# Patient Record
Sex: Male | Born: 1985 | Race: White | Hispanic: No | Marital: Married | Smoking: Never smoker
Health system: Southern US, Community
[De-identification: ages and names within clinical notes are randomized; demographics above are authoritative.]

---

## 2005-09-12 ENCOUNTER — Inpatient Hospital Stay: Payer: Self-pay | Admitting: Specialist

## 2006-06-29 HISTORY — PX: OPEN REDUCTION INTERNAL FIXATION (ORIF) TIBIA/FIBULA FRACTURE: SHX5992

## 2009-05-20 ENCOUNTER — Emergency Department: Payer: Self-pay | Admitting: Emergency Medicine

## 2010-06-23 ENCOUNTER — Emergency Department: Payer: Self-pay | Admitting: Unknown Physician Specialty

## 2010-09-29 IMAGING — CT CT HEAD WITHOUT CONTRAST
2 series · 16 of 30 positions shown, 20 images · non-contrast
Comparison: none

REASON FOR EXAM: Left side weakness
COMMENTS:   LMP: (Male)

PROCEDURE:     CT  - CT HEAD WITHOUT CONTRAST  - May 20, 2009  [DATE]
RESULT:     Comparison:  None
TECHNIQUE: Multiple axial images from the foramen magnum to the vertex were
obtained without IV contrast.

[Series 2: without · axial · non-contrast · 0.43mm/px · z∈[-209,-89]mm · 13 of 28 slices shown, 17 images]
[im 2/28  brain]
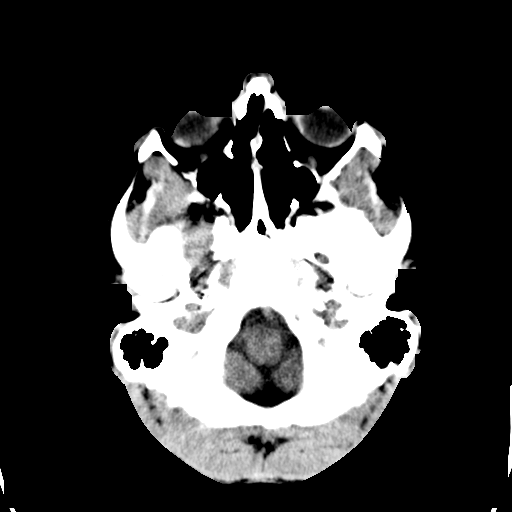
[im 2/28  bone]
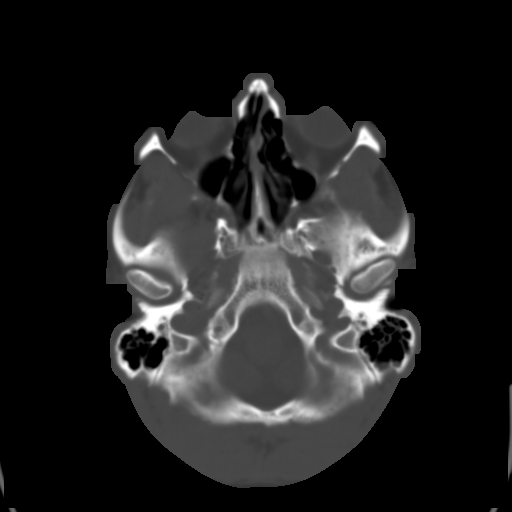
[im 4/28  brain]
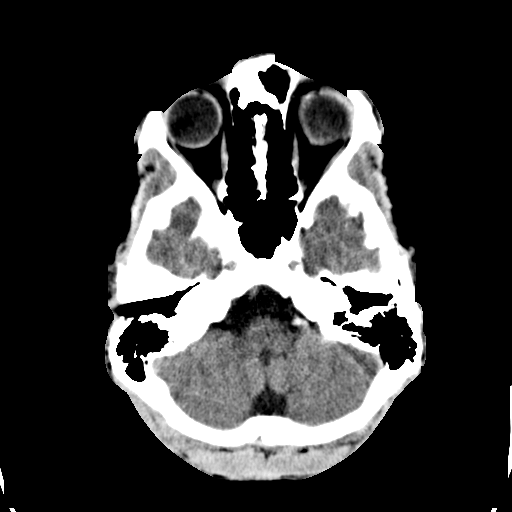
[im 6/28  brain]
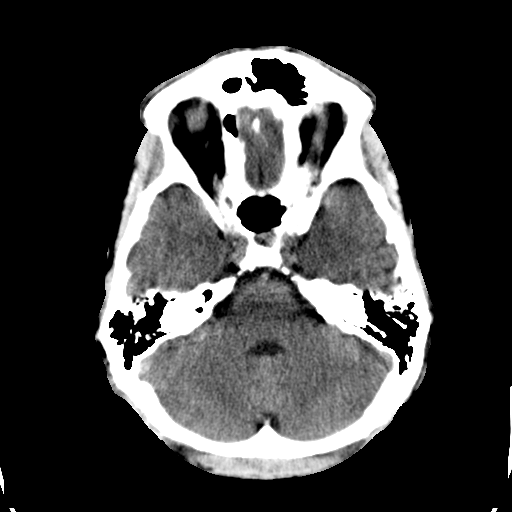
[im 8/28  brain]
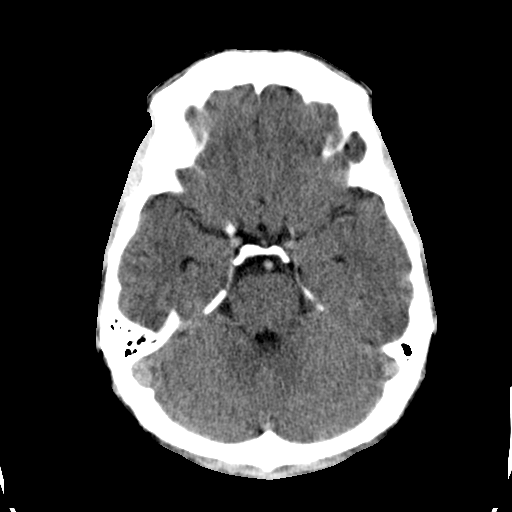
[im 10/28  brain]
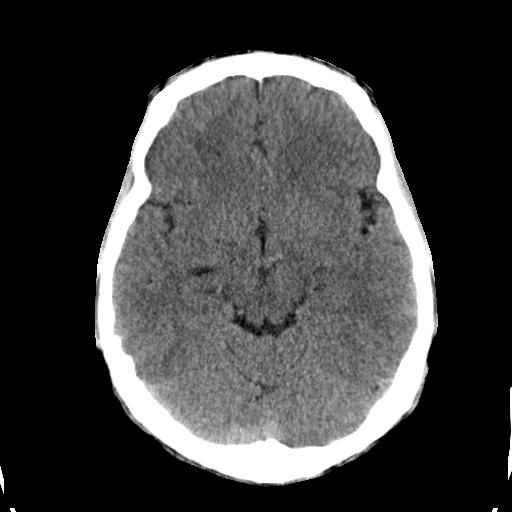
[im 10/28  bone]
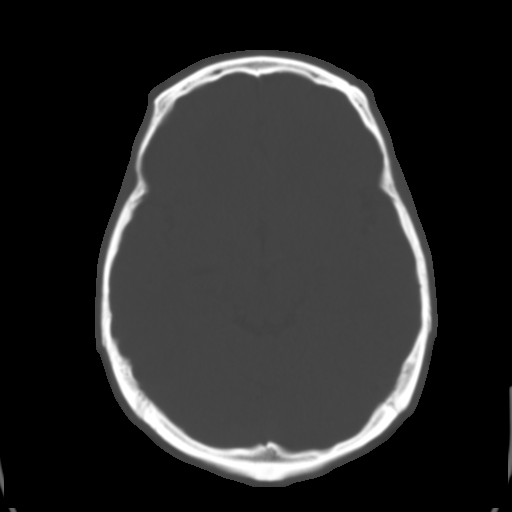
[im 12/28  brain]
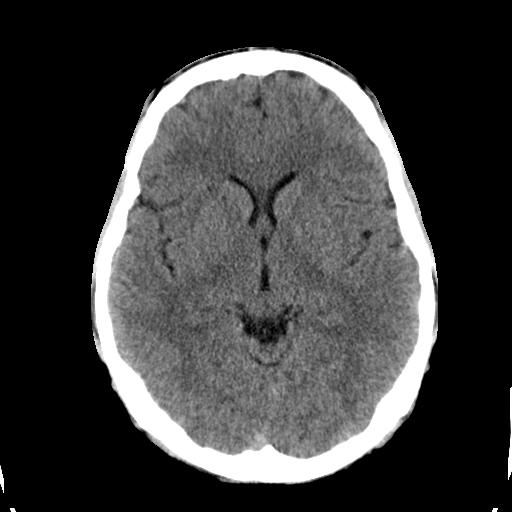
[im 14/28  brain]
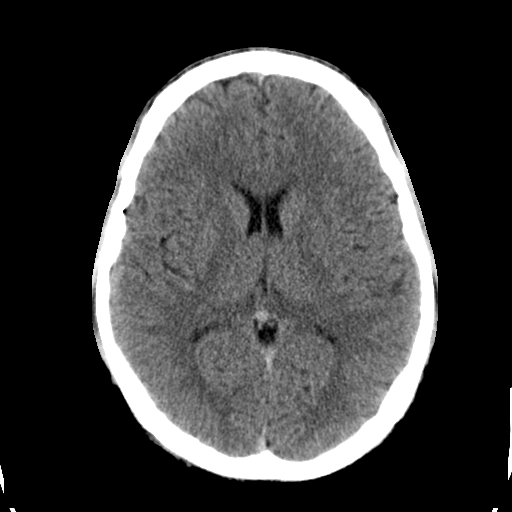
[im 16/28  brain]
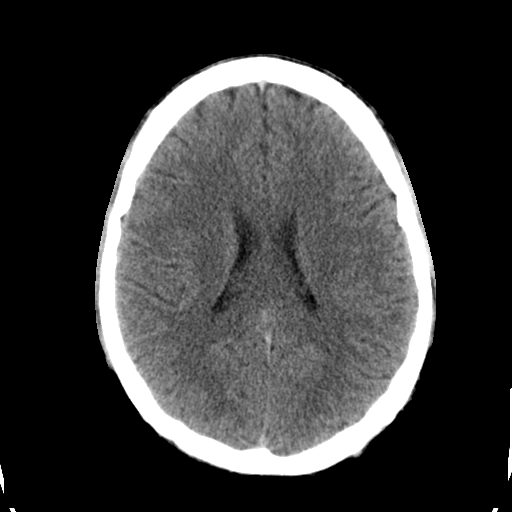
[im 18/28  brain]
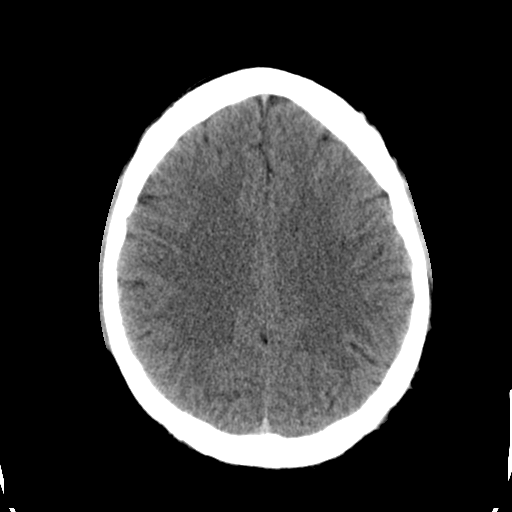
[im 18/28  bone]
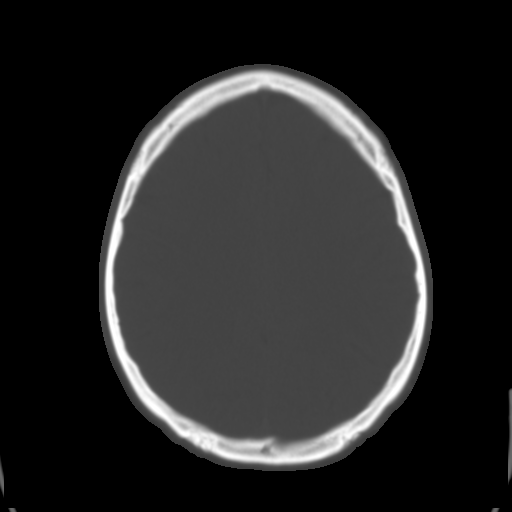
[im 20/28  brain]
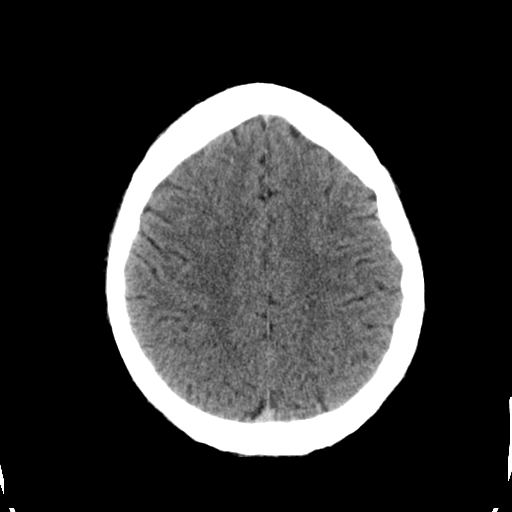
[im 22/28  brain]
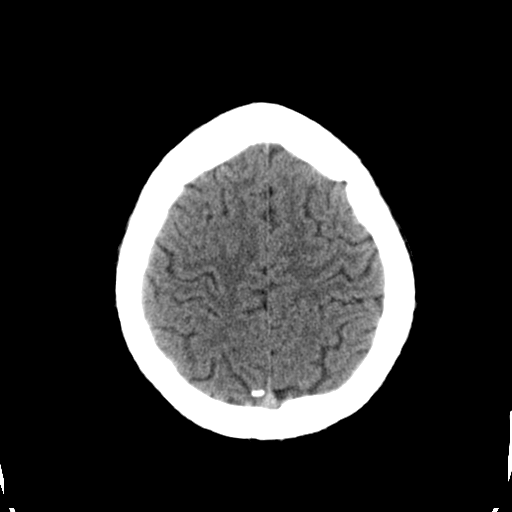
[im 24/28  brain]
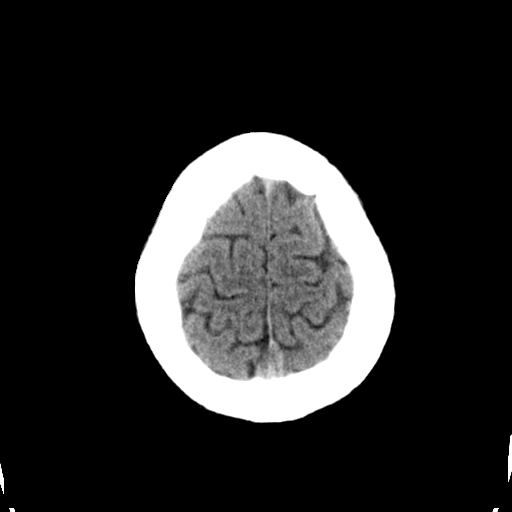
[im 26/28  brain]
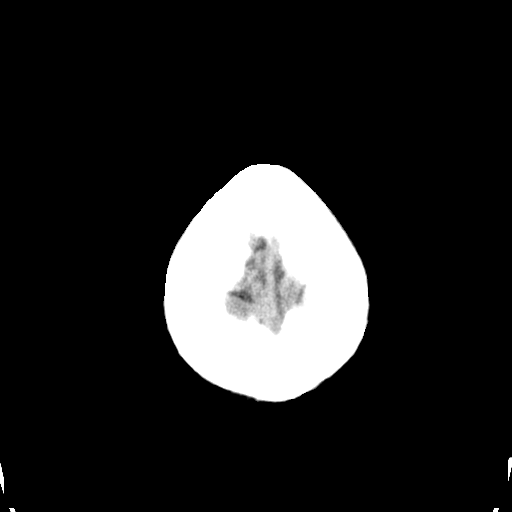
[im 26/28  bone]
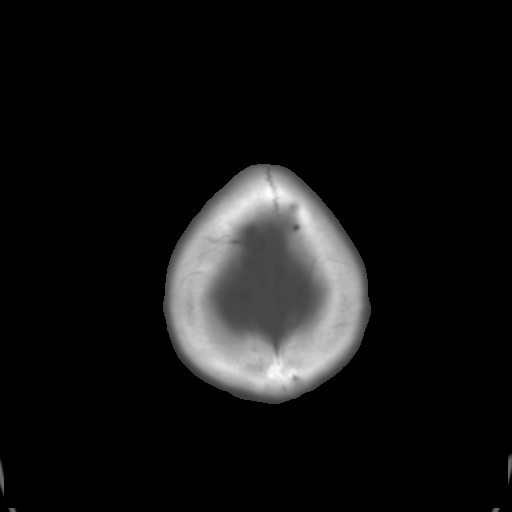

[Series 3: bone · axial · 0.43mm/px · z∈[-209,-169]mm · 3 of 28 slices shown]
[im 2/28  bone]
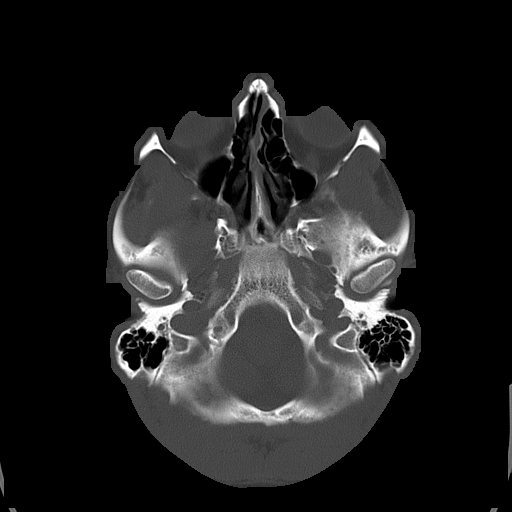
[im 6/28  bone]
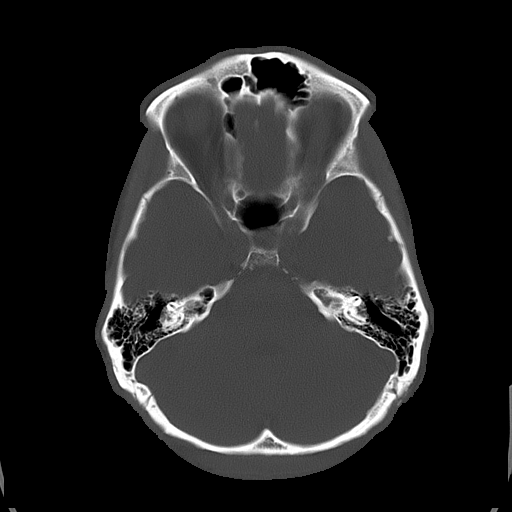
[im 10/28  bone]
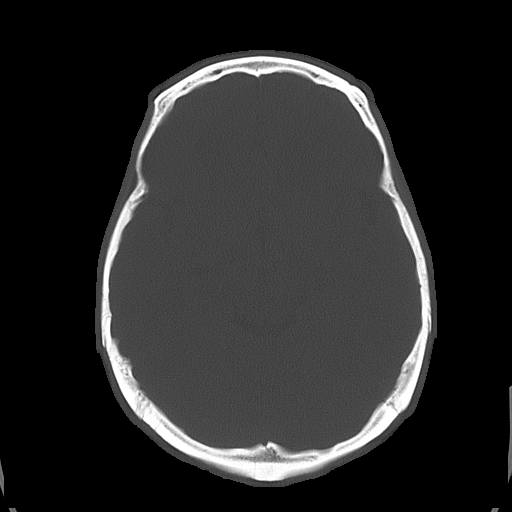

[16 of 30 positions shown; findings below may reference images not displayed]

FINDINGS: There is no evidence of mass effect, midline shift, or extra-axial fluid
collections.  There is no evidence of a space-occupying lesion or
intracranial hemorrhage. There is no evidence of a cortical-based area of
acute infarction.

The ventricles and sulci are appropriate for the patient's age. The basal
cisterns are patent.

Visualized portions of the orbits are unremarkable. The visualized portions
of the paranasal sinuses and mastoid air cells are unremarkable.

The osseous structures are unremarkable.
IMPRESSION: No acute intracranial process.

## 2011-11-02 IMAGING — CR LEFT THUMB 2+V
1 series · 3 of 3 positions shown · non-contrast
Comparison: none

REASON FOR EXAM: injury
COMMENTS:

PROCEDURE:     DXR - DXR THUMB LEFT HAND (1ST DIGIT)  - June 23, 2010  [DATE]
RESULT:     Images of the left thumb demonstrate no definite fracture,
dislocation or radiopaque foreign body.

[Series 1: view not recorded · 0.17mm/px · 3 of 3 slices shown]
[im 1/3]
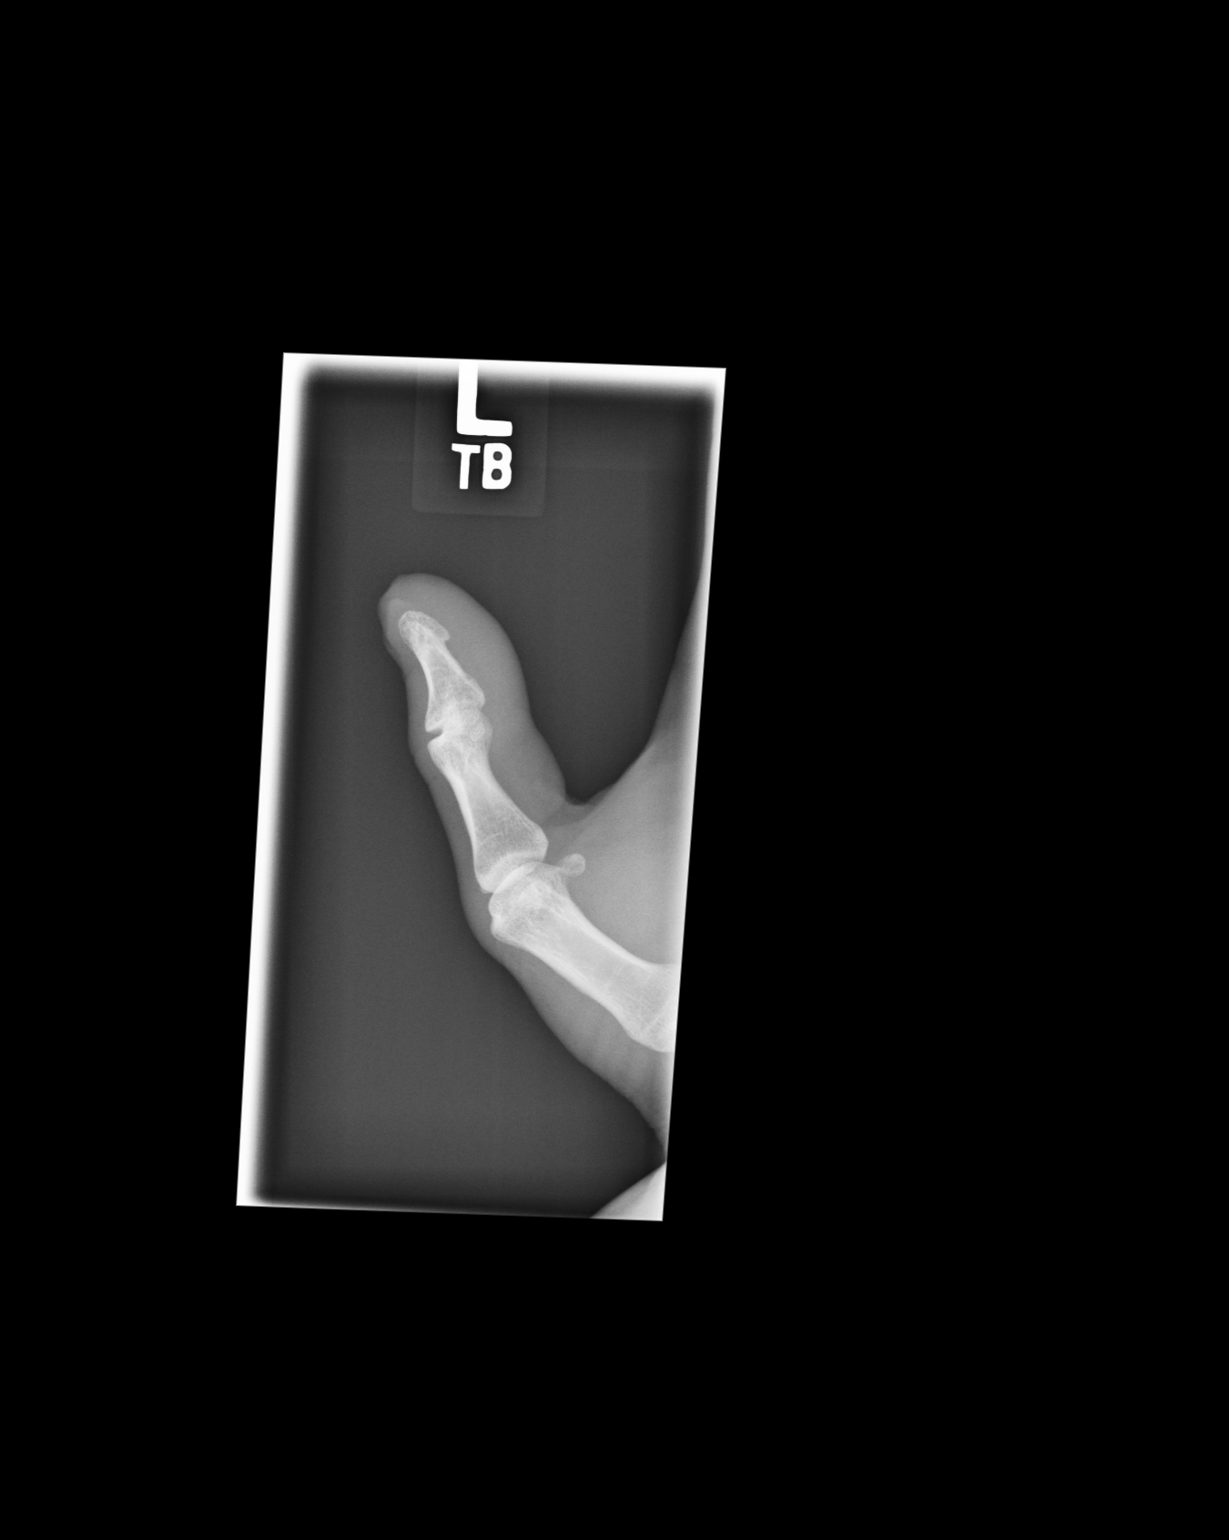
[im 2/3]
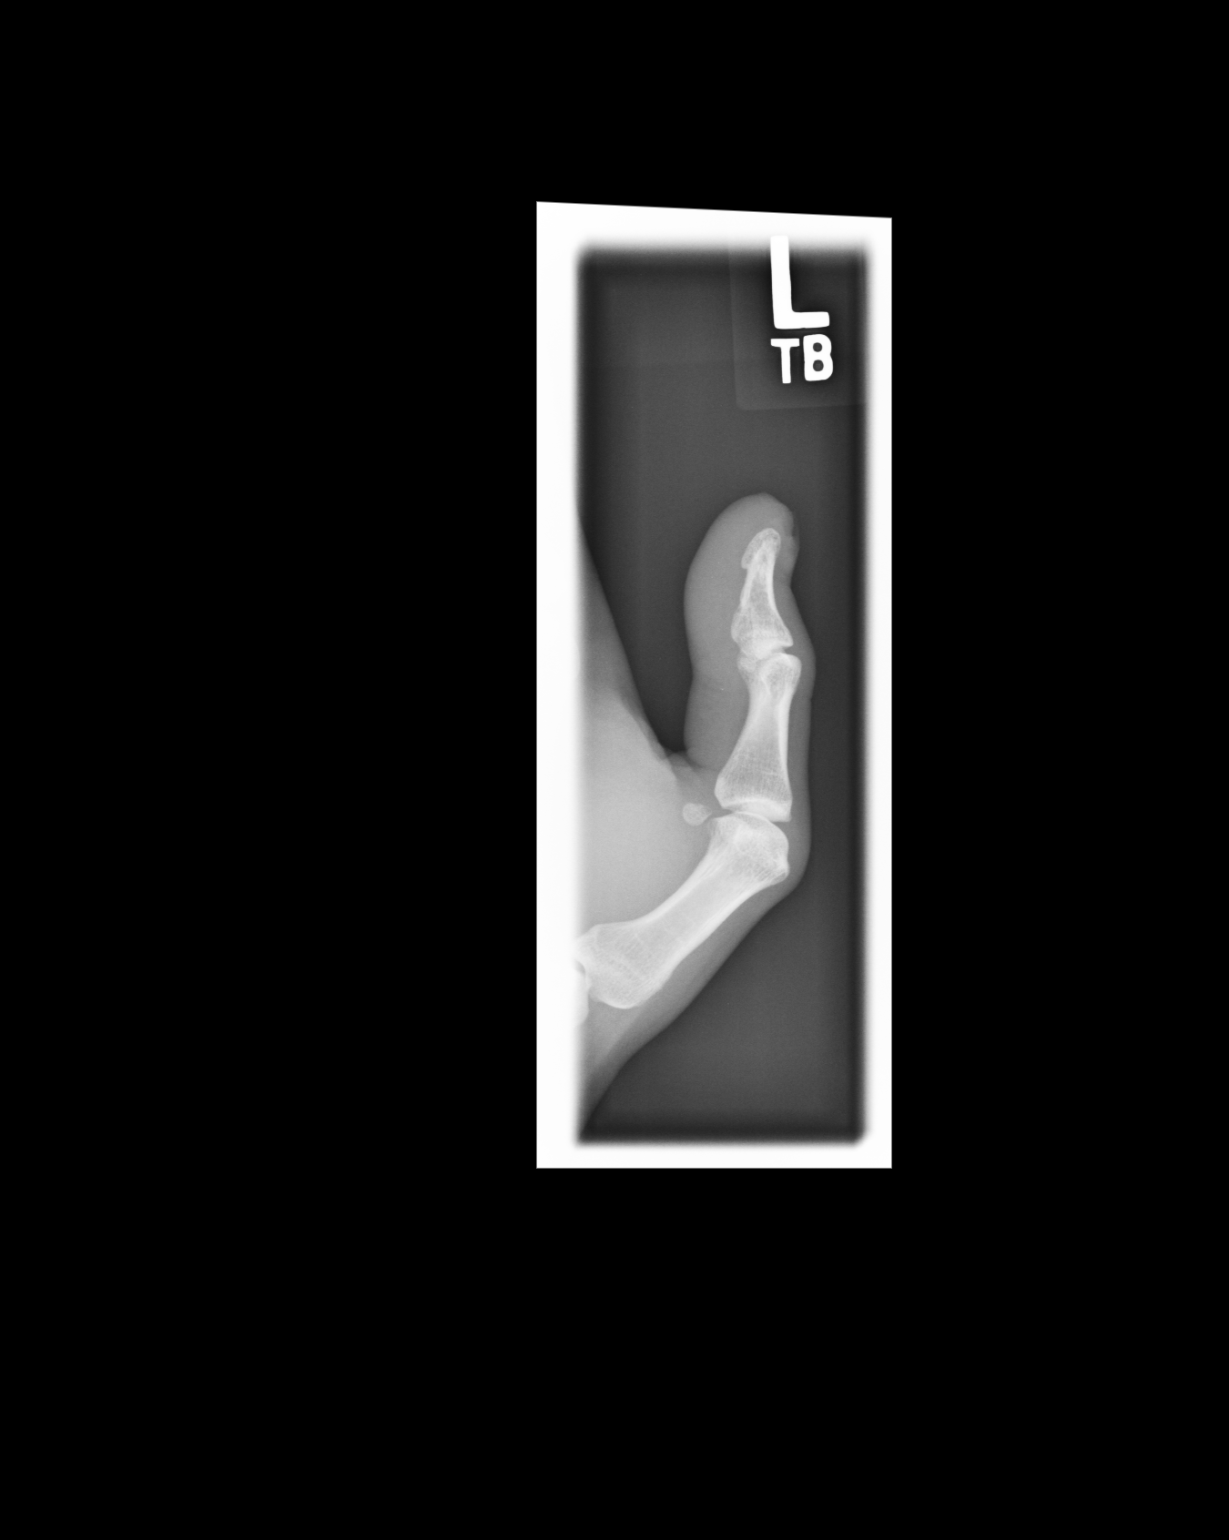
[im 3/3]
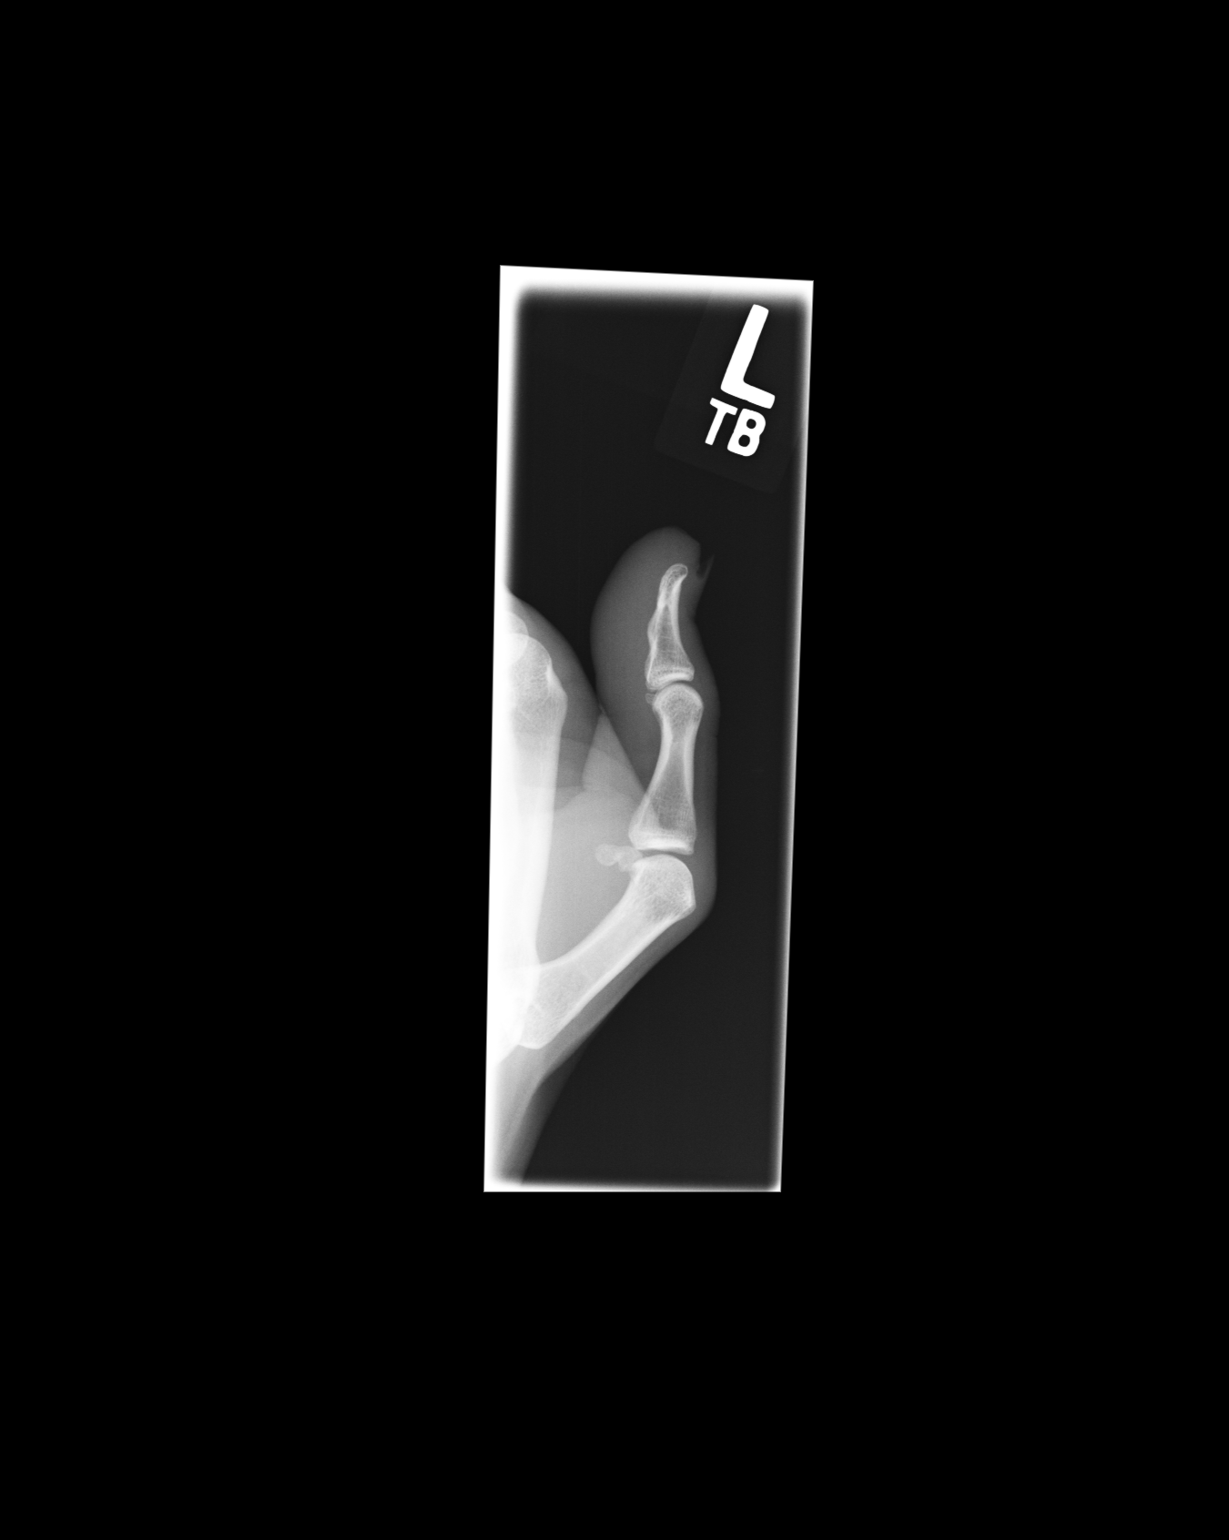

[3 of 3 positions shown; findings below may reference images not displayed]

IMPRESSION: Please see above.

## 2016-02-05 ENCOUNTER — Other Ambulatory Visit: Payer: Self-pay | Admitting: Internal Medicine

## 2016-02-05 DIAGNOSIS — M79604 Pain in right leg: Secondary | ICD-10-CM

## 2016-02-05 DIAGNOSIS — M7918 Myalgia, other site: Secondary | ICD-10-CM

## 2016-02-18 ENCOUNTER — Ambulatory Visit: Payer: BLUE CROSS/BLUE SHIELD

## 2016-10-20 ENCOUNTER — Other Ambulatory Visit: Payer: Self-pay

## 2016-10-21 ENCOUNTER — Ambulatory Visit (INDEPENDENT_AMBULATORY_CARE_PROVIDER_SITE_OTHER): Payer: BLUE CROSS/BLUE SHIELD | Admitting: Urology

## 2016-10-21 ENCOUNTER — Encounter: Payer: Self-pay | Admitting: Urology

## 2016-10-21 VITALS — BP 132/89 | HR 96 | Ht 71.0 in | Wt 225.0 lb

## 2016-10-21 DIAGNOSIS — N529 Male erectile dysfunction, unspecified: Secondary | ICD-10-CM | POA: Diagnosis not present

## 2016-10-21 DIAGNOSIS — Z8639 Personal history of other endocrine, nutritional and metabolic disease: Secondary | ICD-10-CM

## 2016-10-21 DIAGNOSIS — R310 Gross hematuria: Secondary | ICD-10-CM

## 2016-10-21 LAB — URINALYSIS, COMPLETE
BILIRUBIN UA: NEGATIVE
GLUCOSE, UA: NEGATIVE
Ketones, UA: NEGATIVE
Leukocytes, UA: NEGATIVE
Nitrite, UA: NEGATIVE
RBC, UA: NEGATIVE
SPEC GRAV UA: 1.025 (ref 1.005–1.030)
Urobilinogen, Ur: 0.2 mg/dL (ref 0.2–1.0)
pH, UA: 6 (ref 5.0–7.5)

## 2016-10-21 LAB — MICROSCOPIC EXAMINATION

## 2016-10-21 NOTE — Progress Notes (Signed)
10/21/2016 2:55 PM   Nicholas Kelley 12/04/1985 [id number]  Referring provider: Barbette Reichmann, MD [address] [facility] [place], [place] [address]  Chief Complaint  Patient presents with  . Hematuria    new patient, x1 week    HPI: 31 yo M referred for further evaluation gross blood per urethra.    He was using the bathroom last week (BM, not straining) with blood at the tip or the urethral meatus just after BM.  Two days later, he had blood in the at front of Underwear after urinating concerning for recurrent episode.  Around the same time, he reports feeling dull ache in his perineum which resolved spontaneously. Additionally, he has had intermittent dull scrotal discomfort which is also resolved.  He denies any associated clots, obvious gross hematuria mixed in with his urine, dysuria, urgency, or frequency.  He reports that his urinary stream is excellent and does not spray.  No personal history of prostatitis.  He is sexually active with his wife only.  He denies history of GC/ chlamydia.  No history of painful intercourse or sexual trauma.  He does have a diagnosis of hypogonadism with borderline low testosterone levels in the past. He is managed by his PCP with a hydrogel but has not been using this over the past month due to some insurance issues. He also reports occasional difficulties maintaining an erection and uses Viagra for this.  He does have spontaneous nocturnal erections and able to masturbate. He reports that this is been impacting his relationship status with his wife.    He has 3 children and is not seeking any further pregnancies.  PMH: No past medical history on file.  Surgical History: Past Surgical History:  Procedure Laterality Date  . OPEN REDUCTION INTERNAL FIXATION (ORIF) TIBIA/FIBULA FRACTURE  2008     Home Medications:  Allergies as of 10/21/2016   No Known Allergies     Medication List       Accurate as of 10/21/16  11:59 PM. Always use your most recent med list.          fluticasone 50 MCG/ACT nasal spray Commonly known as:  FLONASE Place 1 spray into the nose 2 (two) times daily.   MULTIVITAMIN ADULT PO Take by mouth.   VIAGRA 100 MG tablet Generic drug:  sildenafil Take 1 tablet by mouth daily as needed.       Allergies: No Known Allergies  Family History: Family History  Problem Relation Age of Onset  . Prostate cancer Paternal Grandfather     Social History:  reports that he has never smoked. He has never used smokeless tobacco. He reports that he drinks alcohol. He reports that he does not use drugs.  ROS: UROLOGY Frequent Urination?: No Hard to postpone urination?: No Burning/pain with urination?: No Get up at night to urinate?: No Leakage of urine?: Yes Urine stream starts and stops?: No Trouble starting stream?: No Do you have to strain to urinate?: No Blood in urine?: Yes Urinary tract infection?: No Sexually transmitted disease?: No Injury to kidneys or bladder?: No Painful intercourse?: No Weak stream?: No Erection problems?: Yes Penile pain?: No  Gastrointestinal Nausea?: No Vomiting?: No Indigestion/heartburn?: No Diarrhea?: No Constipation?: No  Constitutional Fever: No Night sweats?: No Weight loss?: No Fatigue?: No  Skin Skin rash/lesions?: No Itching?: No  Eyes Blurred vision?: No Double vision?: No  Ears/Nose/Throat Sore throat?: No Sinus problems?: No  Hematologic/Lymphatic Swollen glands?: No Easy bruising?: No  Cardiovascular Leg swelling?: No Chest pain?: No  Respiratory Cough?: No Shortness of breath?: No  Endocrine Excessive thirst?: No  Musculoskeletal Back pain?: No Joint pain?: No  Neurological Headaches?: No Dizziness?: No  Psychologic Depression?: No Anxiety?: No  Physical Exam: BP 132/89 (BP Location: Left Arm, Patient Position: Sitting, Cuff Size: Large)   Pulse 96   Ht  (1.803 m)   Wt  225 lb (102.1 kg)   BMI 31.38 kg/m   Constitutional:  Alert and oriented, No acute distress. HEENT: [place] AT, moist mucus membranes.  Trachea midline, no masses. Cardiovascular: No clubbing, cyanosis, or edema. Respiratory: Normal respiratory effort, no increased work of breathing. GI: Abdomen is soft, nontender, nondistended, no abdominal masses GU: No CVA tenderness.  Bilateral descended testicles, no masses, nontender. Circumcised phallus with orthotopic meatus/ Skin: No rashes, bruises or suspicious lesions. Rectal: Normal sphincter tone. 20 cc prostate, nontender, no nodules. Neurologic: Grossly intact, no focal deficits, moving all 4 extremities. Psychiatric: Normal mood and affect.  Laboratory Data: Creatinine 1.0 on 10/16/2016 Blood sugars all within normal limits Multiple previous urinalysis including on 10/16/2016 and 02/05/2016 all negative Urine culture from 10/16/2016 negative  Urinalysis Results for orders placed or performed in visit on 10/21/16  GC/Chlamydia Probe Amp  Result Value Ref Range   Chlamydia trachomatis, NAA Negative Negative   Neisseria gonorrhoeae by PCR Negative Negative  Microscopic Examination  Result Value Ref Range   WBC, UA 0-5 0 - 5 /hpf   RBC, UA 0-2 0 - 2 /hpf   Epithelial Cells (non renal) 0-10 0 - 10 /hpf   Mucus, UA Present (A) Not Estab.   Bacteria, UA Few (A) None seen/Few  Urinalysis, Complete  Result Value Ref Range   Specific Gravity, UA 1.025 1.005 - 1.030   pH, UA 6.0 5.0 - 7.5   Color, UA Yellow Yellow   Appearance Ur Clear Clear   Leukocytes, UA Negative Negative   Protein, UA Trace (A) Negative/Trace   Glucose, UA Negative Negative   Ketones, UA Negative Negative   RBC, UA Negative Negative   Bilirubin, UA Negative Negative   Urobilinogen, Ur 0.2 0.2 - 1.0 mg/dL   Nitrite, UA Negative Negative   Microscopic Examination See below:     Pertinent Imaging: n/a  Assessment & Plan:    1. Gross hematuria Etiology  unclear, but based on the nature likely urethral versus prostatic bleeding Cystoscopy today unremarkable GC chlamydia to rule out urethritis No indication for upper tract imaging given the patient's age and nature of bleeding - Urinalysis, Complete - GC/Chlamydia Probe Amp  2. History of hypogonadism Currently managed by PCP on AndroGel Lenghty discussion today about implications of treating borderline hypogonadism and overall testosterone production which will be suppressed as well as spermatogenesis Some concern that AndroGel may be precipitating possible prostatitis  3. Erectile dysfunction, unspecified erectile dysfunction type Suspect some component of psychological/ relationship factor based on the patient's overall very good health and age and nature of dyfunction Continue Viagra as needed  Workup negative, follow up as needed  Vanna Scotland, MD  [facility] [address] [place], [place] [address] [phone]

## 2016-10-23 ENCOUNTER — Telehealth: Payer: Self-pay

## 2016-10-23 LAB — GC/CHLAMYDIA PROBE AMP
CHLAMYDIA, DNA PROBE: NEGATIVE
NEISSERIA GONORRHOEAE BY PCR: NEGATIVE

## 2016-10-23 NOTE — Telephone Encounter (Signed)
-----   Message from Vanna Scotland, MD sent at 10/23/2016 10:30 AM EDT ----- Please let this patient know his test results were negative.  Vanna Scotland, MD

## 2016-10-23 NOTE — Telephone Encounter (Signed)
Did not see pt wife on DPR therefore requested pt return call.

## 2016-10-23 NOTE — Telephone Encounter (Signed)
Spoke with patient no questions

## 2016-10-25 NOTE — Progress Notes (Signed)
   10/21/16  CC:  Chief Complaint  Patient presents with  . Hematuria    new patient, x1 week    HPI: See H&P for details, workup for gross hematuria.  Blood pressure 132/89, pulse 96, height  (1.803 m), weight 225 lb (102.1 kg). NED. A&Ox3.   No respiratory distress   Abd soft, NT, ND Normal phallus with bilateral descended testicles  Cystoscopy Procedure Note  Patient identification was confirmed, informed consent was obtained, and patient was prepped using Betadine solution.  Lidocaine jelly was administered per urethral meatus.    Preoperative abx where received prior to procedure.     Pre-Procedure: - Inspection reveals a normal caliber ureteral meatus.  Procedure: The flexible cystoscope was introduced without difficulty - No urethral strictures/lesions are present. - Normal prostate  - Normal bladder neck - Bilateral ureteral orifices identified - Bladder mucosa  reveals no ulcers, tumors, or lesions - No bladder stones - No trabeculation  Retroflexion unremarkable   Post-Procedure: - Patient tolerated the procedure well

## 2023-01-05 ENCOUNTER — Ambulatory Visit: Payer: BC Managed Care – PPO

## 2023-01-05 DIAGNOSIS — K640 First degree hemorrhoids: Secondary | ICD-10-CM | POA: Diagnosis not present

## 2023-01-05 DIAGNOSIS — K625 Hemorrhage of anus and rectum: Secondary | ICD-10-CM

## 2024-01-18 ENCOUNTER — Other Ambulatory Visit: Payer: Self-pay

## 2024-01-18 ENCOUNTER — Emergency Department

## 2024-01-18 ENCOUNTER — Encounter: Payer: Self-pay | Admitting: Emergency Medicine

## 2024-01-18 DIAGNOSIS — W228XXA Striking against or struck by other objects, initial encounter: Secondary | ICD-10-CM | POA: Diagnosis not present

## 2024-01-18 DIAGNOSIS — S0990XA Unspecified injury of head, initial encounter: Secondary | ICD-10-CM | POA: Diagnosis present

## 2024-01-18 DIAGNOSIS — S0101XA Laceration without foreign body of scalp, initial encounter: Secondary | ICD-10-CM | POA: Diagnosis not present

## 2024-01-18 NOTE — ED Triage Notes (Signed)
 Pt reports he was at the gym tonight and there was an equipment failure and the machine hit pt on both sides of his head at the same time. Pt denies LOC. Pt reports intermittent dizziness and blurred vision since. Approx 1.5 inch laceration noted to the right side of pts head. Bandage in place with bleeding controled.

## 2024-01-19 ENCOUNTER — Emergency Department
Admission: EM | Admit: 2024-01-19 | Discharge: 2024-01-19 | Disposition: A | Discharge status: Home / Self Care | Attending: Emergency Medicine | Admitting: Emergency Medicine

## 2024-01-19 DIAGNOSIS — S0990XA Unspecified injury of head, initial encounter: Secondary | ICD-10-CM

## 2024-01-19 DIAGNOSIS — S0101XA Laceration without foreign body of scalp, initial encounter: Secondary | ICD-10-CM

## 2024-01-19 MED ORDER — BACITRACIN ZINC 500 UNIT/GM EX OINT
TOPICAL_OINTMENT | CUTANEOUS | Status: AC
  Filled 2024-01-19: qty 0.9

## 2024-01-19 MED ORDER — DOUBLE ANTIBIOTIC 500-10000 UNIT/GM EX OINT: 1.0000 | TOPICAL_OINTMENT | Freq: Two times a day (BID) | CUTANEOUS | 0 refills | Status: AC

## 2024-01-19 MED ORDER — BACITRACIN ZINC 500 UNIT/GM EX OINT: TOPICAL_OINTMENT | Freq: Once | CUTANEOUS | Status: DC

## 2024-01-19 NOTE — ED Provider Notes (Signed)
 [facility] Provider Note    Event Date/Time   First MD Initiated Contact with Patient 01/19/24 0121     (approximate)   History   Head Injury   HPI  Nicholas Kelley is a 38 y.o. male with no significant past medical history who presents to the emergency department after head injury. Hit in the head with weights when a piece of equipment failed at the gym.  He was hit in both sides of the head at the same time.  No loss of consciousness.  Not on blood thinners.  Did have some dizziness, blurry vision and was seeing blue lines but this has resolved.  Has a superficial laceration to the right scalp.  No other injury.  Tetanus vaccine up-to-date  History provided by patient, wife.    History reviewed. No pertinent past medical history.  Past Surgical History:  Procedure Laterality Date   OPEN REDUCTION INTERNAL FIXATION (ORIF) TIBIA/FIBULA FRACTURE  2008    MEDICATIONS:  Prior to Admission medications   Medication Sig Start Date End Date Taking? Authorizing Provider  fluticasone (FLONASE) 50 MCG/ACT nasal spray Place 1 spray into the nose 2 (two) times daily. 05/26/16   [provider]  Multiple Vitamins-Minerals (MULTIVITAMIN ADULT PO) Take by mouth. 07/25/09   [provider]  sildenafil (VIAGRA) 100 MG tablet Take 1 tablet by mouth daily as needed. 08/20/16   [provider]    Physical Exam   Triage Vital Signs: ED Triage Vitals  Encounter Vitals Group     BP 01/19/24 0202 121/83     Girls Systolic BP Percentile --      Girls Diastolic BP Percentile --      Boys Systolic BP Percentile --      Boys Diastolic BP Percentile --      Pulse Rate 01/19/24 0202 69     Resp 01/19/24 0202 18     Temp 01/19/24 0202 98.6 F (37 C)     Temp Source 01/19/24 0202 Oral     SpO2 01/19/24 0202 100 %     Weight 01/18/24 2308 220 lb (99.8 kg)     Height 01/18/24 2308 5' 11 (1.803 m)     Head Circumference --      Peak Flow --       Pain Score 01/18/24 2308 3     Pain Loc --      Pain Education --      Exclude from Growth Chart --      Most recent vital signs: Vitals:   01/19/24 0202  BP: 121/83  Pulse: 69  Resp: 18  Temp: 98.6 F (37 C)  SpO2: 100%     CONSTITUTIONAL: Alert, responds appropriately to questions. Well-appearing; well-nourished; GCS 15 HEAD: Normocephalic; 2 cm superficial laceration that is already well-approximated not bleeding to the right temporal area without skull deformity EYES: Conjunctivae clear, pupils appear equal ENT: normal nose; no rhinorrhea; moist mucous membranes NECK: Supple, no midline spinal tenderness, step-off or deformity; trachea midline CARD: RRR; S1 and S2 appreciated; no murmurs, no clicks, no rubs, no gallops RESP: Normal chest excursion without splinting or tachypnea; breath sounds clear and equal bilaterally; no wheezes, no rhonchi, no rales; no hypoxia or respiratory distress ABD/GI: Non-distended EXT: Normal ROM in all joints SKIN: Normal color for age and race; warm NEURO: No facial asymmetry, normal speech, moving all extremities equally, normal gait  ED Results / Procedures / Treatments   LABS: (all labs ordered  are listed, but only abnormal results are displayed) Labs Reviewed - No data to display   EKG:  EKG Interpretation Date/Time:    Ventricular Rate:    PR Interval:    QRS Duration:    QT Interval:    QTC Calculation:   R Axis:      Text Interpretation:            RADIOLOGY: My personal review and interpretation of imaging: CT head shows no acute abnormality.  I have personally reviewed all radiology reports. CT Head Wo Contrast Result Date: 01/18/2024 EXAM: CT HEAD WITHOUT CONTRAST 01/18/2024 11:16:55 PM TECHNIQUE: CT of the head was performed without the administration of intravenous contrast. Automated exposure control, iterative reconstruction, and/or weight based adjustment of the mA/kV was utilized to reduce the radiation  dose to as low as reasonably achievable. COMPARISON: 05/20/2009 CLINICAL HISTORY: Head trauma, minor, normal mental status (Age 48-64y). Pt reports he was at the gym tonight and there was an equipment failure and the machine hit pt on both sides of his head at the same time. Pt denies LOC. Pt reports intermittent dizziness and blurred vision since. Approx 1.5 inch laceration noted to the right side of pts head. Bandage in place with bleeding controled. FINDINGS: BRAIN AND VENTRICLES: No acute hemorrhage. Gray-white differentiation is preserved. No hydrocephalus. No extra-axial collection. No mass effect or midline shift. ORBITS: No acute abnormality. SINUSES: No acute abnormality. SOFT TISSUES AND SKULL: No acute soft tissue abnormality. No skull fracture. IMPRESSION: 1. No acute intracranial abnormality. Electronically signed by: Pinkie Pebbles MD 01/18/2024 11:24 PM EDT RP Workstation: [id number]     PROCEDURES:  Critical Care performed: No   Procedures    IMPRESSION / MDM / ASSESSMENT AND PLAN / ED COURSE  I reviewed the triage vital signs and the nursing notes.  Patient here after a head injury with scalp laceration.    DIFFERENTIAL DIAGNOSIS (includes but not limited to):   Head injury, concussion, scalp laceration, skull fracture, intracranial hemorrhage  Patient's presentation is most consistent with acute presentation with potential threat to life or bodily function.  PLAN: CT of the head obtained from triage and reviewed/interpreted by myself and the radiologist as unremarkable.  Have offered to place staples in patient's wound but given it is superficial and already well-approximated and not bleeding, patient declines at this time.  Will clean this area as he is very worried about infection given he was injured with gym equipment that is used by multiple other people.  No indication for prophylactic antibiotics.  His tetanus vaccine is up-to-date.  Will discharge with bacitracin   that he can apply.  Discussed supportive care instructions alternating Tylenol and Motrin.  Suspect concussion.  Discussed importance of brain rest.  Patient verbalized understanding.  Will provide with work note.   MEDICATIONS GIVEN IN ED: Medications  bacitracin  ointment (has no administration in time range)     ED COURSE:  At this time, I do not feel there is any life-threatening condition present. I reviewed all nursing notes, vitals, pertinent previous records.  All lab and urine results, EKGs, imaging ordered have been independently reviewed and interpreted by myself.  I reviewed all available radiology reports from any imaging ordered this visit.  Based on my assessment, I feel the patient is safe to be discharged home without further emergent workup and can continue workup as an outpatient as needed. Discussed all findings, treatment plan as well as usual and customary return precautions.  They verbalize understanding  and are comfortable with this plan.  Outpatient follow-up has been provided as needed.  All questions have been answered.    CONSULTS:  none   OUTSIDE RECORDS REVIEWED: Reviewed last internal medicine note on 05/25/2023.       FINAL CLINICAL IMPRESSION(S) / ED DIAGNOSES   Final diagnoses:  Injury of head, initial encounter  Laceration of scalp, initial encounter     Rx / DC Orders   ED Discharge Orders          Ordered    polymixin-bacitracin  (POLYSPORIN) 500-10000 UNIT/GM OINT ointment  2 times daily        01/19/24 0146             Note:  This document was prepared using Dragon voice recognition software and may include unintentional dictation errors.   Konner Saiz, Josette SAILOR, [place] 01/19/24 [phone]

## 2024-01-19 NOTE — Discharge Instructions (Addendum)
 You may alternate over the counter Tylenol 1000 mg every 6 hours as needed for pain, fever and Ibuprofen 800 mg every 6-8 hours as needed for pain, fever.  Please take Ibuprofen with food.  Do not take more than 4000 mg of Tylenol (acetaminophen) in a 24 hour period.  You have had a head injury resulting in a concussion.  A concussion is a clinical diagnosis and not seen on imaging (CT, MRI).  Please avoid alcohol, sedatives for the next week.  Please rest and drink plenty of water.  We recommend that you avoid any activity that may lead to another head injury for at least 1 week or until your symptoms have completely resolved.  We also recommend brain rest to ensure the best possible long term outcomes - please avoid TV, cell phones, tablets, computers as much as possible for the next 48 hours.

## 2024-05-05 ENCOUNTER — Emergency Department

## 2024-05-05 ENCOUNTER — Other Ambulatory Visit: Payer: Self-pay

## 2024-05-05 ENCOUNTER — Emergency Department
Admission: EM | Admit: 2024-05-05 | Discharge: 2024-05-05 | Disposition: A | Discharge status: Home / Self Care | Source: Ambulatory Visit | Attending: Emergency Medicine | Admitting: Emergency Medicine

## 2024-05-05 DIAGNOSIS — M79661 Pain in right lower leg: Secondary | ICD-10-CM | POA: Insufficient documentation

## 2024-05-05 DIAGNOSIS — M25511 Pain in right shoulder: Secondary | ICD-10-CM

## 2024-05-05 DIAGNOSIS — M7581 Other shoulder lesions, right shoulder: Secondary | ICD-10-CM | POA: Diagnosis not present
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
## 2024-05-05 NOTE — Discharge Instructions (Signed)
 I am suspicious that she may have a tendinitis or a potential rotator cuff injury in the right shoulder, possibly of the subscapularis.  Please follow-up with orthopedics, make an appointment to be seen and further evaluated to the cause of your shoulder pain.  Certainly return to the ER right away if you develop severe persistent pain, difficulty breathing, chest pain, fevers, swelling of the shoulder joint or calf, cold or blue foot or arm, or other concerns arise.
